# Patient Record
Sex: Male | Born: 1980 | Race: Black or African American | Hispanic: No | Marital: Single | Smoking: Never smoker
Health system: Southern US, Community
[De-identification: ages and names within clinical notes are randomized; demographics above are authoritative.]

---

## 2000-10-09 ENCOUNTER — Emergency Department (HOSPITAL_COMMUNITY): Admission: EM | Admit: 2000-10-09 | Discharge: 2000-10-09 | Payer: Self-pay

## 2003-05-26 ENCOUNTER — Emergency Department (HOSPITAL_COMMUNITY): Admission: EM | Admit: 2003-05-26 | Discharge: 2003-05-26 | Payer: Self-pay | Admitting: Emergency Medicine

## 2004-02-03 ENCOUNTER — Emergency Department (HOSPITAL_COMMUNITY): Admission: EM | Admit: 2004-02-03 | Discharge: 2004-02-03 | Payer: Self-pay | Admitting: Emergency Medicine

## 2004-04-02 ENCOUNTER — Emergency Department (HOSPITAL_COMMUNITY): Admission: EM | Admit: 2004-04-02 | Discharge: 2004-04-02 | Payer: Self-pay | Admitting: Emergency Medicine

## 2007-09-15 ENCOUNTER — Emergency Department (HOSPITAL_COMMUNITY): Admission: EM | Admit: 2007-09-15 | Discharge: 2007-09-16 | Payer: Self-pay | Admitting: Plastic Surgery

## 2010-06-18 ENCOUNTER — Emergency Department (HOSPITAL_COMMUNITY): Payer: Self-pay

## 2010-06-18 ENCOUNTER — Emergency Department (HOSPITAL_COMMUNITY)
Admission: EM | Admit: 2010-06-18 | Discharge: 2010-06-18 | Payer: Self-pay | Attending: Emergency Medicine | Admitting: Emergency Medicine

## 2010-06-18 DIAGNOSIS — S71009A Unspecified open wound, unspecified hip, initial encounter: Secondary | ICD-10-CM | POA: Insufficient documentation

## 2010-06-18 DIAGNOSIS — S81009A Unspecified open wound, unspecified knee, initial encounter: Secondary | ICD-10-CM | POA: Insufficient documentation

## 2010-06-18 DIAGNOSIS — S51809A Unspecified open wound of unspecified forearm, initial encounter: Secondary | ICD-10-CM | POA: Insufficient documentation

## 2010-06-18 DIAGNOSIS — W540XXA Bitten by dog, initial encounter: Secondary | ICD-10-CM | POA: Insufficient documentation

## 2010-06-18 DIAGNOSIS — R079 Chest pain, unspecified: Secondary | ICD-10-CM | POA: Insufficient documentation

## 2010-06-18 DIAGNOSIS — S71109A Unspecified open wound, unspecified thigh, initial encounter: Secondary | ICD-10-CM | POA: Insufficient documentation

## 2018-05-20 ENCOUNTER — Encounter (HOSPITAL_COMMUNITY): Payer: Self-pay | Admitting: *Deleted

## 2018-05-20 ENCOUNTER — Other Ambulatory Visit: Payer: Self-pay

## 2018-05-20 ENCOUNTER — Emergency Department (HOSPITAL_COMMUNITY)
Admission: EM | Admit: 2018-05-20 | Discharge: 2018-05-20 | Disposition: A | Discharge status: Home / Self Care | Payer: Self-pay | Attending: Emergency Medicine | Admitting: Emergency Medicine

## 2018-05-20 ENCOUNTER — Emergency Department (HOSPITAL_BASED_OUTPATIENT_CLINIC_OR_DEPARTMENT_OTHER): Payer: Self-pay

## 2018-05-20 DIAGNOSIS — F17210 Nicotine dependence, cigarettes, uncomplicated: Secondary | ICD-10-CM | POA: Insufficient documentation

## 2018-05-20 DIAGNOSIS — I824Y2 Acute embolism and thrombosis of unspecified deep veins of left proximal lower extremity: Secondary | ICD-10-CM | POA: Insufficient documentation

## 2018-05-20 DIAGNOSIS — F1290 Cannabis use, unspecified, uncomplicated: Secondary | ICD-10-CM | POA: Insufficient documentation

## 2018-05-20 DIAGNOSIS — M7989 Other specified soft tissue disorders: Secondary | ICD-10-CM

## 2018-05-20 DIAGNOSIS — M79609 Pain in unspecified limb: Secondary | ICD-10-CM

## 2018-05-20 LAB — POCT I-STAT EG7
Acid-Base Excess: 1 mmol/L (ref 0.0–2.0)
Bicarbonate: 26.7 mmol/L (ref 20.0–28.0)
Calcium, Ion: 1.18 mmol/L (ref 1.15–1.40)
HCT: 40 % (ref 39.0–52.0)
Hemoglobin: 13.6 g/dL (ref 13.0–17.0)
O2 Saturation: 66 %
Potassium: 3.8 mmol/L (ref 3.5–5.1)
Sodium: 140 mmol/L (ref 135–145)
TCO2: 28 mmol/L (ref 22–32)
pCO2, Ven: 44.2 mmHg (ref 44.0–60.0)
pH, Ven: 7.389 (ref 7.250–7.430)
pO2, Ven: 35 mmHg (ref 32.0–45.0)

## 2018-05-20 LAB — CBG MONITORING, ED: Glucose-Capillary: 87 mg/dL (ref 70–99)

## 2018-05-20 LAB — I-STAT CREATININE, ED: Creatinine, Ser: 1 mg/dL (ref 0.61–1.24)

## 2018-05-20 MED ORDER — RIVAROXABAN 15 MG PO TABS
15.0000 mg | ORAL_TABLET | Freq: Two times a day (BID) | ORAL | Status: DC
  Administered 2018-05-20: 15 mg via ORAL
  Filled 2018-05-20 (×2): qty 1

## 2018-05-20 MED ORDER — RIVAROXABAN (XARELTO) EDUCATION KIT FOR DVT/PE PATIENTS: PACK | Freq: Once | Status: DC

## 2018-05-20 MED ORDER — RIVAROXABAN 15 MG PO TABS
15.0000 mg | ORAL_TABLET | Freq: Two times a day (BID) | ORAL | Status: DC
  Filled 2018-05-20 (×2): qty 1

## 2018-05-20 MED ORDER — RIVAROXABAN (XARELTO) VTE STARTER PACK (15 & 20 MG): ORAL_TABLET | ORAL | 0 refills | Status: AC
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
## 2018-05-20 NOTE — ED Notes (Signed)
CBG was 87 

## 2018-05-20 NOTE — ED Triage Notes (Signed)
PT reports 4 days of Lt calf pain ,upper inner thigh pain and Lt popliteal space. Pt denies injury to leg.

## 2018-05-20 NOTE — Care Management (Signed)
Provided 30 day trial card for Xarelto starter pack, and information to follow up with the [facility] to also assist with medications. Information forwarded to the clinic's  Nurse CM.

## 2018-05-20 NOTE — Progress Notes (Signed)
Lower extremity venous has been completed.   Preliminary results in CV Proc.   Blanch Media 05/20/2018 11:30 AM

## 2018-05-20 NOTE — ED Triage Notes (Signed)
Pharmacy at bedside

## 2018-05-20 NOTE — Discharge Instructions (Addendum)
You have been diagnosed today with DVT of the left leg.  At this time there does not appear to be the presence of an emergent medical condition, however there is always the potential for conditions to change. Please read and follow the below instructions.  Please return to the Emergency Department immediately for any new or worsening symptoms. Please be sure to follow up with your Primary Care Provider within one week regarding your visit today; please call their office to schedule an appointment even if you are feeling better for a follow-up visit. Please call the vascular surgeon's office Dr. Chestine Spore on Monday to schedule follow-up regarding your blood clot. Please continue take the blood thinning medication Xarelto as prescribed.  As we discussed if you have any falls or injuries you should return to the emergency department for evaluation as blood thinner use increases your risk of bleeding.  Get help right away if: You have: New or increased pain, swelling, or redness in an arm or leg. Numbness or tingling in an arm or leg. Shortness of breath. Chest pain. A rapid or irregular heartbeat. A severe headache or confusion. A cut that will not stop bleeding. There is blood in your vomit, stool, or urine. You have a fall or accident, or you hit your head. You feel light-headed or dizzy. You cough up blood.  Please read the additional information packets attached to your discharge summary.  ================== Information on my medicine - XARELTO (rivaroxaban)  This medication education was reviewed with me or my healthcare representative as part of my discharge preparation.   WHY WAS XARELTO PRESCRIBED FOR YOU? Xarelto was prescribed to treat blood clots that may have been found in the veins of your legs (deep vein thrombosis) or in your lungs (pulmonary embolism) and to reduce the risk of them occurring again.  WHAT DO YOU NEED TO KNOW ABOUT XARELTO? The starting dose is one 15 mg  tablet taken TWICE daily with food for the FIRST 21 DAYS then on 06/10/2018  the dose is changed to one 20 mg tablet taken ONCE A DAY with your evening meal.  DO NOT stop taking Xarelto without talking to the health care provider who prescribed the medication.  Refill your prescription for 20 mg tablets before you run out.  After discharge, you should have regular check-up appointments with your healthcare provider that is prescribing your Xarelto.  In the future your dose may need to be changed if your kidney function changes by a significant amount.  WHAT DO YOU DO IF YOU MISS A DOSE? If you are taking Xarelto TWICE DAILY and you miss a dose, take it as soon as you remember. You may take two 15 mg tablets (total 30 mg) at the same time then resume your regularly scheduled 15 mg twice daily the next day.  If you are taking Xarelto ONCE DAILY and you miss a dose, take it as soon as you remember on the same day then continue your regularly scheduled once daily regimen the next day. Do not take two doses of Xarelto at the same time.   IMPORTANT SAFETY INFORMATION Xarelto is a blood thinner medicine that can cause bleeding. You should call your healthcare provider right away if you experience any of the following: Bleeding from an injury or your nose that does not stop. Unusual colored urine (red or dark brown) or unusual colored stools (red or black). Unusual bruising for unknown reasons. A serious fall or if you hit your head (even  if there is no bleeding).  Some medicines may interact with Xarelto and might increase your risk of bleeding while on Xarelto. To help avoid this, consult your healthcare provider or pharmacist prior to using any new prescription or non-prescription medications, including herbals, vitamins, non-steroidal anti-inflammatory drugs (NSAIDs) and supplements.  This website has more information on Xarelto: [email or web address]

## 2018-05-20 NOTE — ED Notes (Signed)
Patient verbalizes understanding of discharge instructions. Opportunity for questioning and answers were provided. Armband removed by staff, pt discharged from ED ambulatory to home.  

## 2018-07-08 ENCOUNTER — Other Ambulatory Visit: Payer: Self-pay

## 2018-07-08 ENCOUNTER — Emergency Department (HOSPITAL_COMMUNITY)
Admission: EM | Admit: 2018-07-08 | Discharge: 2018-07-08 | Payer: Self-pay | Attending: Emergency Medicine | Admitting: Emergency Medicine

## 2018-07-08 ENCOUNTER — Encounter (HOSPITAL_COMMUNITY): Payer: Self-pay | Admitting: *Deleted

## 2018-07-08 ENCOUNTER — Emergency Department (HOSPITAL_COMMUNITY): Payer: Self-pay

## 2018-07-08 DIAGNOSIS — Z7901 Long term (current) use of anticoagulants: Secondary | ICD-10-CM | POA: Insufficient documentation

## 2018-07-08 DIAGNOSIS — Y939 Activity, unspecified: Secondary | ICD-10-CM | POA: Insufficient documentation

## 2018-07-08 DIAGNOSIS — Y999 Unspecified external cause status: Secondary | ICD-10-CM | POA: Insufficient documentation

## 2018-07-08 DIAGNOSIS — Y929 Unspecified place or not applicable: Secondary | ICD-10-CM | POA: Insufficient documentation

## 2018-07-08 DIAGNOSIS — S81832A Puncture wound without foreign body, left lower leg, initial encounter: Secondary | ICD-10-CM | POA: Insufficient documentation

## 2018-07-08 DIAGNOSIS — W3400XA Accidental discharge from unspecified firearms or gun, initial encounter: Secondary | ICD-10-CM

## 2018-07-08 LAB — CBC WITH DIFFERENTIAL/PLATELET
Abs Immature Granulocytes: 0.03 10*3/uL (ref 0.00–0.07)
Basophils Absolute: 0 10*3/uL (ref 0.0–0.1)
Basophils Relative: 1 %
Eosinophils Absolute: 0.1 10*3/uL (ref 0.0–0.5)
Eosinophils Relative: 1 %
HCT: 38 % — ABNORMAL LOW (ref 39.0–52.0)
Hemoglobin: 12.5 g/dL — ABNORMAL LOW (ref 13.0–17.0)
Immature Granulocytes: 0 %
Lymphocytes Relative: 42 %
Lymphs Abs: 2.9 10*3/uL (ref 0.7–4.0)
MCH: 29.3 pg (ref 26.0–34.0)
MCHC: 32.9 g/dL (ref 30.0–36.0)
MCV: 89.2 fL (ref 80.0–100.0)
Monocytes Absolute: 1 10*3/uL (ref 0.1–1.0)
Monocytes Relative: 14 %
Neutro Abs: 3 10*3/uL (ref 1.7–7.7)
Neutrophils Relative %: 42 %
Platelets: 301 10*3/uL (ref 150–400)
RBC: 4.26 MIL/uL (ref 4.22–5.81)
RDW: 12.7 % (ref 11.5–15.5)
WBC: 7 10*3/uL (ref 4.0–10.5)
nRBC: 0 % (ref 0.0–0.2)

## 2018-07-08 LAB — COMPREHENSIVE METABOLIC PANEL
ALT: 21 U/L (ref 0–44)
AST: 29 U/L (ref 15–41)
Albumin: 4 g/dL (ref 3.5–5.0)
Alkaline Phosphatase: 78 U/L (ref 38–126)
Anion gap: 12 (ref 5–15)
BUN: 14 mg/dL (ref 6–20)
CO2: 19 mmol/L — ABNORMAL LOW (ref 22–32)
Calcium: 8.8 mg/dL — ABNORMAL LOW (ref 8.9–10.3)
Chloride: 109 mmol/L (ref 98–111)
Creatinine, Ser: 1.21 mg/dL (ref 0.61–1.24)
GFR calc Af Amer: >60 mL/min (ref >60)
GFR calc non Af Amer: >60 mL/min (ref >60)
Glucose, Bld: 137 mg/dL — ABNORMAL HIGH (ref 70–99)
Potassium: 3.3 mmol/L — ABNORMAL LOW (ref 3.5–5.1)
Sodium: 140 mmol/L (ref 135–145)
Total Bilirubin: 0.7 mg/dL (ref 0.3–1.2)
Total Protein: 7.7 g/dL (ref 6.5–8.1)

## 2018-07-08 LAB — PROTIME-INR
INR: 1 (ref 0.8–1.2)
Prothrombin Time: 13.5 seconds (ref 11.4–15.2)

## 2018-07-08 MED ORDER — CEPHALEXIN 500 MG PO CAPS: 1000.0000 mg | ORAL_CAPSULE | Freq: Two times a day (BID) | ORAL | 0 refills | Status: AC

## 2018-07-08 MED ORDER — MORPHINE SULFATE (PF) 4 MG/ML IV SOLN
4.0000 mg | Freq: Once | INTRAVENOUS | Status: AC
  Administered 2018-07-08: 4 mg via INTRAVENOUS
  Filled 2018-07-08: qty 1

## 2018-07-08 MED ORDER — CEFAZOLIN SODIUM-DEXTROSE 2-4 GM/100ML-% IV SOLN
2.0000 g | Freq: Once | INTRAVENOUS | Status: AC
  Administered 2018-07-08: 20:00:00 2 g via INTRAVENOUS
  Filled 2018-07-08: qty 100

## 2018-07-08 MED ORDER — ONDANSETRON HCL 4 MG/2ML IJ SOLN
4.0000 mg | Freq: Once | INTRAMUSCULAR | Status: AC
  Administered 2018-07-08: 4 mg via INTRAVENOUS
  Filled 2018-07-08: qty 2

## 2018-07-08 MED ORDER — SODIUM CHLORIDE 0.9 % IV BOLUS
1000.0000 mL | Freq: Once | INTRAVENOUS | Status: AC
  Administered 2018-07-08: 1000 mL via INTRAVENOUS
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
## 2018-07-08 NOTE — Discharge Instructions (Addendum)
Take keflex 1000 mg twice daily for 5 days.   Take tylenol, motrin for pain   See your doctor  Return to ER if you have uncontrolled bleeding, fever, severe pain
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
## 2018-07-08 NOTE — ED Notes (Signed)
Pt mother is Nile Riggs and can be contacted at [phone]
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
## 2018-07-10 ENCOUNTER — Encounter (HOSPITAL_COMMUNITY): Payer: Self-pay | Admitting: *Deleted
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
## 2021-10-12 ENCOUNTER — Emergency Department (HOSPITAL_COMMUNITY)
Admission: EM | Admit: 2021-10-12 | Discharge: 2021-10-12 | Disposition: A | Discharge status: Home / Self Care | Payer: Self-pay | Attending: Emergency Medicine | Admitting: Emergency Medicine

## 2021-10-12 ENCOUNTER — Other Ambulatory Visit: Payer: Self-pay

## 2021-10-12 ENCOUNTER — Encounter (HOSPITAL_COMMUNITY): Payer: Self-pay | Admitting: Radiology

## 2021-10-12 ENCOUNTER — Emergency Department (HOSPITAL_COMMUNITY): Payer: Self-pay

## 2021-10-12 DIAGNOSIS — N132 Hydronephrosis with renal and ureteral calculous obstruction: Secondary | ICD-10-CM | POA: Insufficient documentation

## 2021-10-12 DIAGNOSIS — Z7901 Long term (current) use of anticoagulants: Secondary | ICD-10-CM | POA: Insufficient documentation

## 2021-10-12 DIAGNOSIS — N200 Calculus of kidney: Secondary | ICD-10-CM

## 2021-10-12 LAB — CBC
HCT: 37.7 % — ABNORMAL LOW (ref 39.0–52.0)
Hemoglobin: 12.8 g/dL — ABNORMAL LOW (ref 13.0–17.0)
MCH: 30.5 pg (ref 26.0–34.0)
MCHC: 34 g/dL (ref 30.0–36.0)
MCV: 90 fL (ref 80.0–100.0)
Platelets: 301 10*3/uL (ref 150–400)
RBC: 4.19 MIL/uL — ABNORMAL LOW (ref 4.22–5.81)
RDW: 12.6 % (ref 11.5–15.5)
WBC: 7.4 10*3/uL (ref 4.0–10.5)
nRBC: 0 % (ref 0.0–0.2)

## 2021-10-12 LAB — URINALYSIS, ROUTINE W REFLEX MICROSCOPIC
Bacteria, UA: NONE SEEN
Bilirubin Urine: NEGATIVE
Glucose, UA: NEGATIVE mg/dL
Ketones, ur: NEGATIVE mg/dL
Leukocytes,Ua: NEGATIVE
Nitrite: NEGATIVE
Protein, ur: NEGATIVE mg/dL
Specific Gravity, Urine: 1.008 (ref 1.005–1.030)
pH: 8 (ref 5.0–8.0)

## 2021-10-12 LAB — BASIC METABOLIC PANEL
Anion gap: 8 (ref 5–15)
BUN: 11 mg/dL (ref 6–20)
CO2: 26 mmol/L (ref 22–32)
Calcium: 9.4 mg/dL (ref 8.9–10.3)
Chloride: 108 mmol/L (ref 98–111)
Creatinine, Ser: 1.24 mg/dL (ref 0.61–1.24)
GFR, Estimated: >60 mL/min (ref >60)
Glucose, Bld: 113 mg/dL — ABNORMAL HIGH (ref 70–99)
Potassium: 3.8 mmol/L (ref 3.5–5.1)
Sodium: 142 mmol/L (ref 135–145)

## 2021-10-12 MED ORDER — KETOROLAC TROMETHAMINE 15 MG/ML IJ SOLN
15.0000 mg | Freq: Once | INTRAMUSCULAR | Status: AC
  Administered 2021-10-12: 15 mg via INTRAVENOUS
  Filled 2021-10-12: qty 1

## 2021-10-12 MED ORDER — KETOROLAC TROMETHAMINE 10 MG PO TABS: 10.0000 mg | ORAL_TABLET | Freq: Four times a day (QID) | ORAL | 0 refills | Status: AC | PRN

## 2021-10-12 MED ORDER — AMLODIPINE BESYLATE 5 MG PO TABS
5.0000 mg | ORAL_TABLET | Freq: Once | ORAL | Status: AC
  Administered 2021-10-12: 5 mg via ORAL
  Filled 2021-10-12: qty 1

## 2021-10-12 MED ORDER — SODIUM CHLORIDE 0.9 % IV BOLUS
1000.0000 mL | Freq: Once | INTRAVENOUS | Status: AC
  Administered 2021-10-12: 1000 mL via INTRAVENOUS

## 2021-10-12 MED ORDER — TAMSULOSIN HCL 0.4 MG PO CAPS: 0.4000 mg | ORAL_CAPSULE | Freq: Every day | ORAL | 0 refills | Status: AC

## 2021-10-12 MED ORDER — HYDROMORPHONE HCL 1 MG/ML IJ SOLN
0.5000 mg | Freq: Once | INTRAMUSCULAR | Status: AC
  Administered 2021-10-12: 0.5 mg via INTRAVENOUS
  Filled 2021-10-12: qty 1

## 2021-10-12 MED ORDER — ONDANSETRON HCL 4 MG/2ML IJ SOLN
4.0000 mg | Freq: Once | INTRAMUSCULAR | Status: AC
  Administered 2021-10-12: 4 mg via INTRAVENOUS
  Filled 2021-10-12: qty 2

## 2021-10-12 MED ORDER — ONDANSETRON HCL 4 MG PO TABS: 4.0000 mg | ORAL_TABLET | Freq: Four times a day (QID) | ORAL | 0 refills | Status: AC | PRN

## 2021-10-12 MED ORDER — HYDROCODONE-ACETAMINOPHEN 5-325 MG PO TABS: 1.0000 | ORAL_TABLET | Freq: Four times a day (QID) | ORAL | 0 refills | Status: AC | PRN

## 2021-10-12 MED ORDER — AMLODIPINE BESYLATE 5 MG PO TABS: 5.0000 mg | ORAL_TABLET | Freq: Every day | ORAL | 0 refills | Status: AC

## 2021-10-12 NOTE — Discharge Instructions (Addendum)
Please return to ED with any new symptoms such as inability to urinate, fevers, pain not controlled with medication Please read attached guide concerning kidney stones Please utilize urine strainer provided Please follow-up with urology.  Please call and make an appointment to be seen. Please begin taking tamsulosin once daily to allow and assist in stone passage

## 2021-10-12 NOTE — ED Provider Notes (Addendum)
[facility]-EMERGENCY DEPT Provider Note   CSN: [id number] Arrival date & time: 10/12/21  9163     History  Chief Complaint  Patient presents with   Flank Pain    Hunter Sullivan is a 41 y.o. male with medical history of kidney stones in 2009.  Patient presents to ED for evaluation of right-sided flank pain.  Patient reports that last night around 8 PM he developed a "uncomfortable sensation" in his right flank.  Patient reports that sensation has progressively worsened and developed pain that he describes as a pressure that is stabbing him in the right side of his back.  Patient reports he was unable to sleep last night due to the pain.  The patient states that this pain feels similar to the pain he experienced when he had a kidney stone in 2009.  The patient denies seeing urology recently.  Patient denies any fevers, vomiting, diarrhea, abdominal pain, decreased urination, blood in urine, burning with urination.   Flank Pain Pertinent negatives include no abdominal pain.       Home Medications Prior to Admission medications   Medication Sig Start Date End Date Taking? Authorizing Provider  amLODipine (NORVASC) 5 MG tablet Take 1 tablet (5 mg total) by mouth daily. 10/12/21  Yes Al Decant, PA-C  HYDROcodone-acetaminophen (NORCO/VICODIN) 5-325 MG tablet Take 1 tablet by mouth every 6 (six) hours as needed for severe pain. 10/12/21  Yes Al Decant, PA-C  ketorolac (TORADOL) 10 MG tablet Take 1 tablet (10 mg total) by mouth every 6 (six) hours as needed. 10/12/21  Yes Al Decant, PA-C  ondansetron (ZOFRAN) 4 MG tablet Take 1 tablet (4 mg total) by mouth every 6 (six) hours as needed for nausea or vomiting. 10/12/21  Yes Al Decant, PA-C  tamsulosin (FLOMAX) 0.4 MG CAPS capsule Take 1 capsule (0.4 mg total) by mouth daily after breakfast. 10/12/21  Yes Al Decant, PA-C  cephALEXin (KEFLEX) 500 MG capsule Take 2 capsules  (1,000 mg total) by mouth 2 (two) times daily. 07/08/18   Charlynne Pander, MD  Rivaroxaban 15 & 20 MG TBPK Take as directed on package: Start with one 15mg  tablet by mouth twice a day with food. On Day 22, switch to one 20mg  tablet once a day with food. 05/20/18   , PA-C      Allergies    Patient has no known allergies.    Review of Systems   Review of Systems  Constitutional:  Negative for chills and fever.  Gastrointestinal:  Positive for nausea. Negative for abdominal pain, diarrhea and vomiting.  Genitourinary:  Positive for flank pain. Negative for difficulty urinating, dysuria and hematuria.  All other systems reviewed and are negative.   Physical Exam Updated Vital Signs BP (!) 189/120 (BP Location: Right Arm)   Pulse (!) 57   Temp 97.9 F (36.6 C) (Oral)   Resp 17   Ht 6' (1.829 m)   Wt 120.2 kg   SpO2 99%   BMI 35.94 kg/m  Physical Exam Vitals and nursing note reviewed.  Constitutional:      General: He is not in acute distress.    Appearance: Normal appearance. He is not ill-appearing, toxic-appearing or diaphoretic.  HENT:     Head: Normocephalic and atraumatic.     Nose: Nose normal. No congestion.     Mouth/Throat:     Mouth: Mucous membranes are moist.     Pharynx: Oropharynx is clear.  Eyes:     Extraocular Movements: Extraocular movements intact.     Conjunctiva/sclera: Conjunctivae normal.     Pupils: Pupils are equal, round, and reactive to light.  Cardiovascular:     Rate and Rhythm: Normal rate and regular rhythm.  Pulmonary:     Effort: Pulmonary effort is normal.     Breath sounds: Normal breath sounds. No wheezing.  Abdominal:     General: Abdomen is flat. Bowel sounds are normal.     Palpations: Abdomen is soft.     Tenderness: There is no abdominal tenderness. There is right CVA tenderness. There is no left CVA tenderness.  Musculoskeletal:     Cervical back: Normal range of motion and neck supple. No tenderness.   Skin:    General: Skin is warm and dry.     Capillary Refill: Capillary refill takes less than 2 seconds.  Neurological:     Mental Status: He is alert and oriented to person, place, and time.     ED Results / Procedures / Treatments   Labs (all labs ordered are listed, but only abnormal results are displayed) Labs Reviewed  URINALYSIS, ROUTINE W REFLEX MICROSCOPIC - Abnormal; Notable for the following components:      Result Value   Color, Urine STRAW (*)    Hgb urine dipstick MODERATE (*)    All other components within normal limits  BASIC METABOLIC PANEL - Abnormal; Notable for the following components:   Glucose, Bld 113 (*)    All other components within normal limits  CBC - Abnormal; Notable for the following components:   RBC 4.19 (*)    Hemoglobin 12.8 (*)    HCT 37.7 (*)    All other components within normal limits    EKG None  Radiology CT Renal Stone Study  Result Date: 10/12/2021 CLINICAL DATA:  Right-sided flank pain.  Kidney stone suspected. EXAM: CT ABDOMEN AND PELVIS WITHOUT CONTRAST TECHNIQUE: Multidetector CT imaging of the abdomen and pelvis was performed following the standard protocol without IV contrast. RADIATION DOSE REDUCTION: This exam was performed according to the departmental dose-optimization program which includes automated exposure control, adjustment of the mA and/or kV according to patient size and/or use of iterative reconstruction technique. COMPARISON:  None FINDINGS: Lower chest: Lung bases are clear. Hepatobiliary: Liver appears normal without contrast. No calcified gallstones. Pancreas: Normal Spleen: Normal Adrenals/Urinary Tract: Adrenal glands are normal. The left kidney contains 1 or 2 adjacent nonobstructing stones in the lower pole, maximal dimension 5 mm. No hydronephrosis on the left. The right kidney contains a nonobstructing 4 mm stone in the upper pole. There is swelling of the kidney with surrounding edema and  hydroureteronephrosis secondary to a 2 mm stone in the proximal ureter at about the level of the lower pole of the right kidney. There is pyelo sinus extravasation. No stone more distal within the right ureter, within the bladder or within the visible urethra. Stomach/Bowel: Stomach and small intestine are normal. Normal appendix. No colon abnormality. Vascular/Lymphatic: Normal Reproductive: Normal Other: No free fluid or air. Musculoskeletal: No acute or significant finding. IMPRESSION: Severe hydronephrosis on the right due to a 2 mm stone within the proximal right ureter at the level of the lower pole the right kidney. Pyelo sinus extravasation with regional retroperitoneal edema. 5 mm nonobstructing stone or stones in the lower pole of the left kidney. 4 mm nonobstructing stone in the upper pole of the right kidney. Electronically Signed   By: Scherrie BatemanMark  Shogry M.D.  On: 10/12/2021 07:58    Procedures Procedures   Medications Ordered in ED Medications  amLODipine (NORVASC) tablet 5 mg (has no administration in time range)  sodium chloride 0.9 % bolus 1,000 mL (0 mLs Intravenous Stopped 10/12/21 0841)  ondansetron (ZOFRAN) injection 4 mg (4 mg Intravenous Given 10/12/21 0726)  ketorolac (TORADOL) 15 MG/ML injection 15 mg (15 mg Intravenous Given 10/12/21 0725)  HYDROmorphone (DILAUDID) injection 0.5 mg (0.5 mg Intravenous Given 10/12/21 8786)    ED Course/ Medical Decision Making/ A&P                           Medical Decision Making Amount and/or Complexity of Data Reviewed Labs: ordered. Radiology: ordered.  Risk Prescription drug management.   41 year old male presents to the ED for evaluation of right-sided flank pain.  Please see HPI for further details.  On examination the patient has right-sided flank pain.  The patient denies any dysuria or urinary symptoms.  The patient is afebrile and nontachycardic.  Patient lung sounds clear bilaterally, not hypoxic.  Patient nontoxic.  Due to  patient presentation of right-sided flank pain we will proceed with CT renal stone study, urinalysis, CBC, BMP.  We will treat this patient initially with 15 mg of Toradol, 4 mg of Zofran, 1 L of fluid.  Differential includes UTI, renal stone, pyelonephritis.  Patient pain moderately controlled with 15 mg Toradol however the patient was continuing to complain of discomfort and pain so 0.5 mg Dilaudid was administered.  Patient CBC without leukocytosis.  The patient BMP is without a creatinine elevation.  Patient urinalysis shows moderate hemoglobin and urine without leukocytes.  The CT renal stone study does show a 2 mm obstructing right-sided stone with severe hydronephrosis as well as pyelo sinus extravasation.  Patient discussed with attending Dr. Pearline Cables who states that this patient can follow-up with urology in 5 days.  Patient will be discharged home with expulsive therapy, pain control, antinausea medication.  Patient was given return precautions to include inability to urinate, increased pain, fevers.  Patient voiced understanding of my instructions.  Patient had all of his questions answered to his satisfaction prior to discharge.  The patient stable at this time for discharge home.  Update: Nursing staff advised me that while she was discharging this patient she noted that he was hypertensive.  The patient denies any chest pain, shortness of breath, headache, blurred vision.  The patient will be started on 5 mg amlodipine and advised to follow-up with PCP.  Patient denies any history of hypertension.  Patient blood pressure noted to be 203/110 on the monitor in the room.  This was 1 hour after pain medication was administered and the patient denied any pain. Patient does not have PCP so he will be referred to one.  Final Clinical Impression(s) / ED Diagnoses Final diagnoses:  Kidney stone    Rx / DC Orders ED Discharge Orders          Ordered    tamsulosin (FLOMAX) 0.4 MG CAPS capsule   Daily after breakfast        10/12/21 1011    HYDROcodone-acetaminophen (NORCO/VICODIN) 5-325 MG tablet  Every 6 hours PRN        10/12/21 1011    ondansetron (ZOFRAN) 4 MG tablet  Every 6 hours PRN        10/12/21 1011    ketorolac (TORADOL) 10 MG tablet  Every 6 hours PRN  10/12/21 1011    amLODipine (NORVASC) 5 MG tablet  Daily        10/12/21 1020                   Al Decant, [place] 10/12/21 1023    Ernie Avena, MD 10/12/21 1454

## 2021-10-12 NOTE — ED Triage Notes (Signed)
Ambulatory to ED with c/o R sided flank pain. Hx of kidney stone, states this feels the same.
